# Patient Record
Sex: Female | Born: 1985 | Race: White | Hispanic: No | Marital: Married | Smoking: Never smoker
Health system: Southern US, Community
[De-identification: ages and names within clinical notes are randomized; demographics above are authoritative.]

## PROBLEM LIST (undated history)

## (undated) DIAGNOSIS — F32A Depression, unspecified: Secondary | ICD-10-CM

## (undated) DIAGNOSIS — F419 Anxiety disorder, unspecified: Secondary | ICD-10-CM

## (undated) HISTORY — PX: TUBAL LIGATION: SHX77

---
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
## 2004-08-07 ENCOUNTER — Emergency Department (HOSPITAL_COMMUNITY): Admission: EM | Admit: 2004-08-07 | Discharge: 2004-08-07 | Payer: Self-pay | Admitting: Family Medicine

## 2004-09-10 ENCOUNTER — Ambulatory Visit (HOSPITAL_COMMUNITY): Admission: RE | Admit: 2004-09-10 | Discharge: 2004-09-10 | Payer: Self-pay | Admitting: Obstetrics and Gynecology

## 2004-10-22 ENCOUNTER — Other Ambulatory Visit: Admission: RE | Admit: 2004-10-22 | Discharge: 2004-10-22 | Payer: Self-pay | Admitting: Obstetrics and Gynecology

## 2005-01-24 ENCOUNTER — Inpatient Hospital Stay (HOSPITAL_COMMUNITY): Admission: AD | Admit: 2005-01-24 | Discharge: 2005-01-24 | Payer: Self-pay | Admitting: Obstetrics and Gynecology

## 2005-02-28 ENCOUNTER — Inpatient Hospital Stay (HOSPITAL_COMMUNITY): Admission: AD | Admit: 2005-02-28 | Discharge: 2005-03-02 | Payer: Self-pay | Admitting: Obstetrics and Gynecology

## 2005-02-28 ENCOUNTER — Encounter (INDEPENDENT_AMBULATORY_CARE_PROVIDER_SITE_OTHER): Payer: Self-pay | Admitting: *Deleted

## 2005-09-12 ENCOUNTER — Emergency Department (HOSPITAL_COMMUNITY): Admission: EM | Admit: 2005-09-12 | Discharge: 2005-09-12 | Payer: Self-pay | Admitting: Family Medicine

## 2005-09-17 ENCOUNTER — Emergency Department (HOSPITAL_COMMUNITY): Admission: EM | Admit: 2005-09-17 | Discharge: 2005-09-17 | Payer: Self-pay | Admitting: Family Medicine

## 2005-10-25 ENCOUNTER — Other Ambulatory Visit: Admission: RE | Admit: 2005-10-25 | Discharge: 2005-10-25 | Payer: Self-pay | Admitting: Obstetrics and Gynecology

## 2005-11-24 ENCOUNTER — Emergency Department (HOSPITAL_COMMUNITY): Admission: EM | Admit: 2005-11-24 | Discharge: 2005-11-24 | Payer: Self-pay | Admitting: Family Medicine

## 2006-07-09 ENCOUNTER — Emergency Department (HOSPITAL_COMMUNITY): Admission: EM | Admit: 2006-07-09 | Discharge: 2006-07-09 | Payer: Self-pay | Admitting: Emergency Medicine

## 2006-07-09 IMAGING — US US FETAL BPP W/O NONSTRESS
1 series · 14 of 27 positions shown · non-contrast
Comparison: 09/10/04.

CLINICAL DATA: 35 week gestation with bleeding.  

 BIOPHYSICAL PROFILE

[Series 1: us fetal bpp w/o nonstress · 0.33mm/px · 14 of 27 slices shown]
[im 1/27]
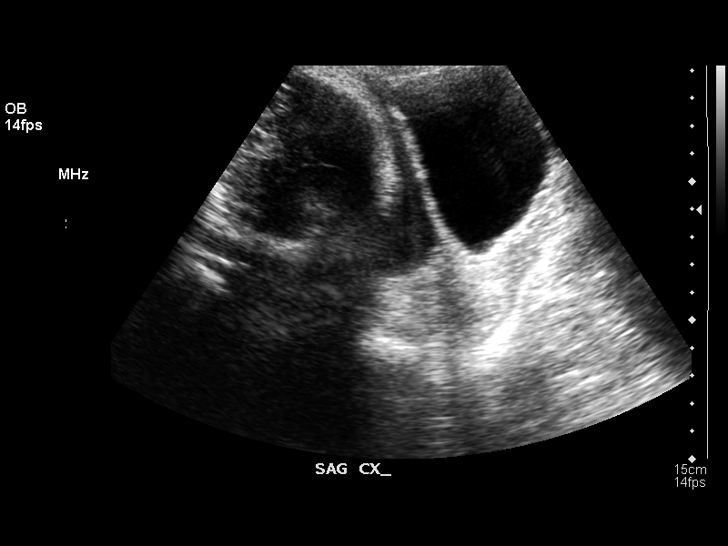
[im 3/27]
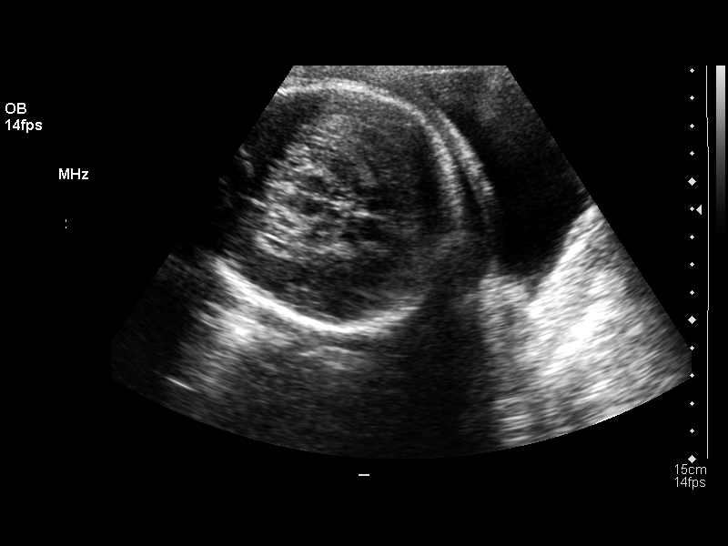
[im 5/27]
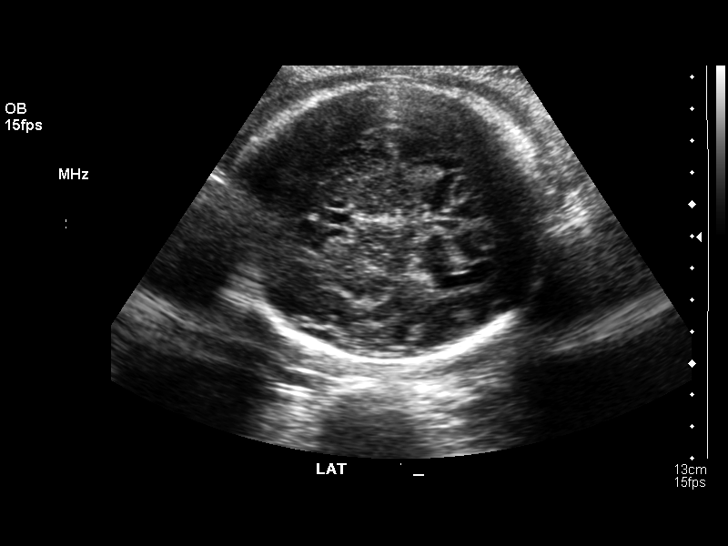
[im 7/27]
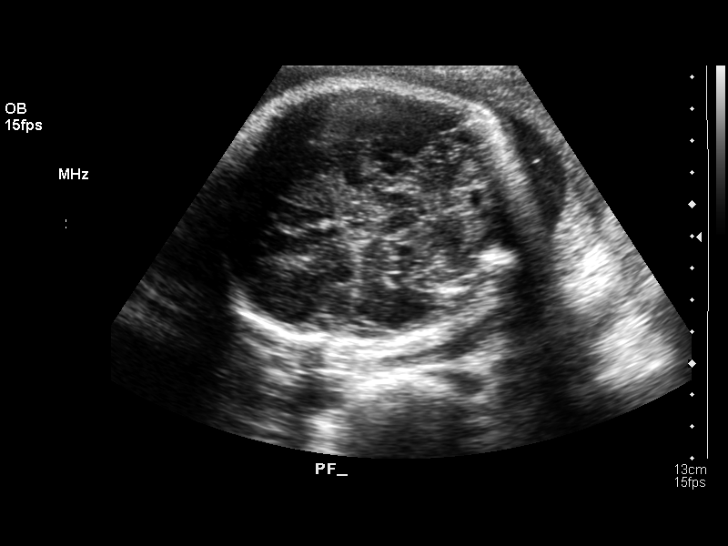
[im 9/27]
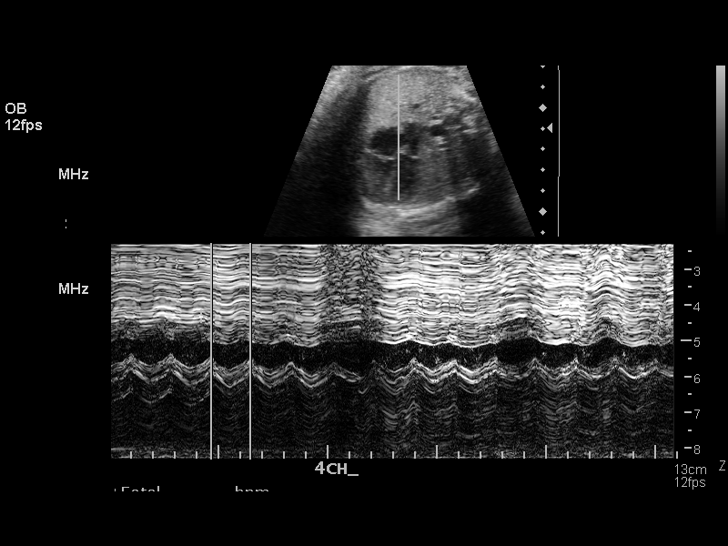
[im 11/27]
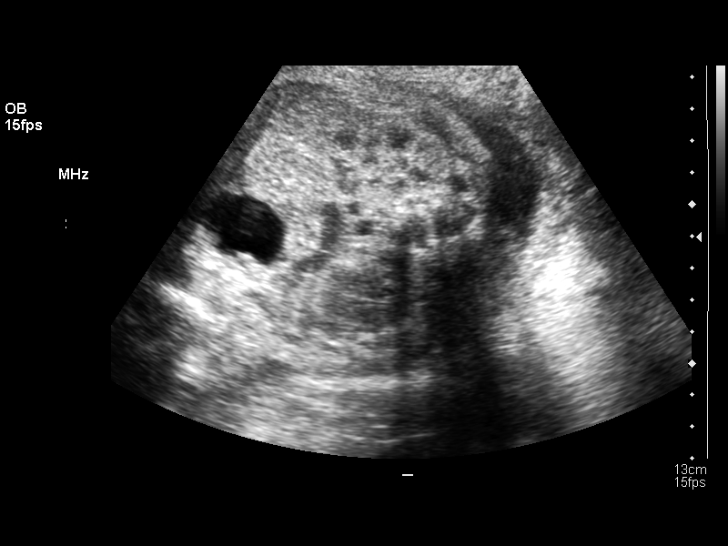
[im 13/27]
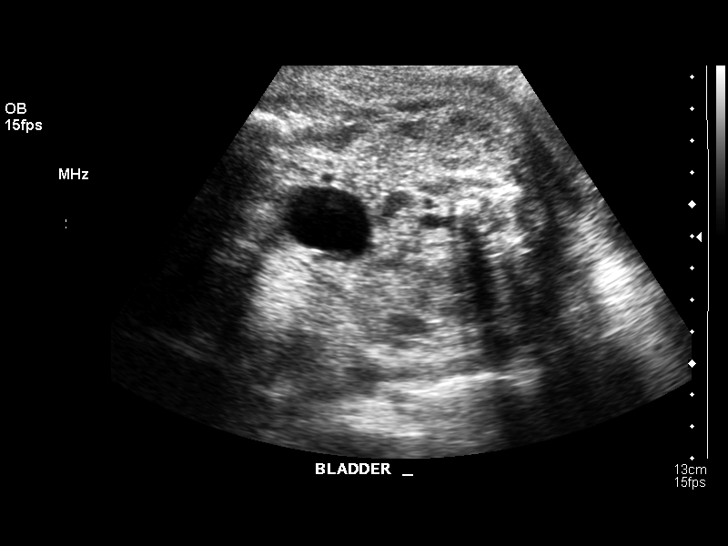
[im 15/27]
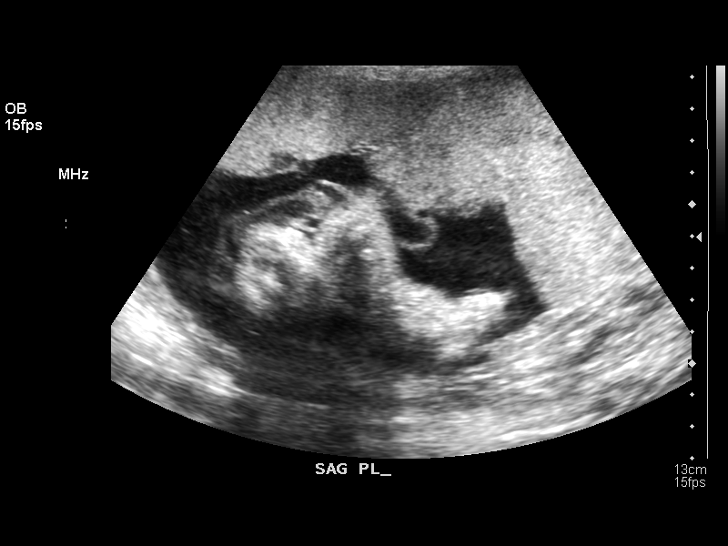
[im 17/27]
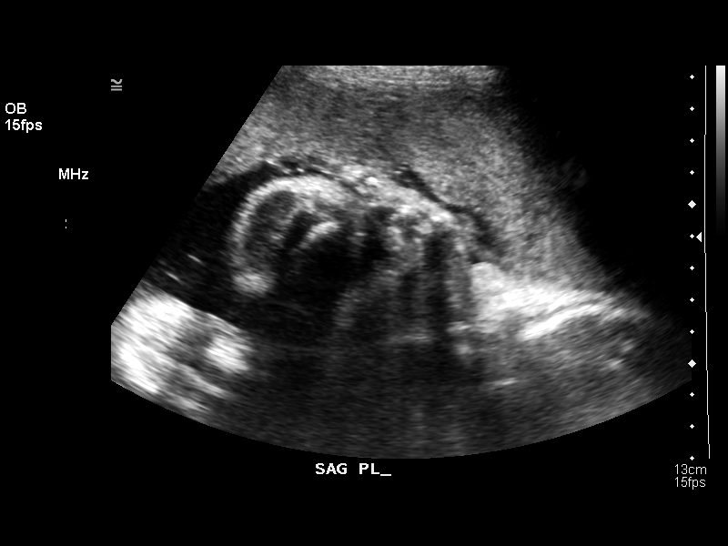
[im 19/27]
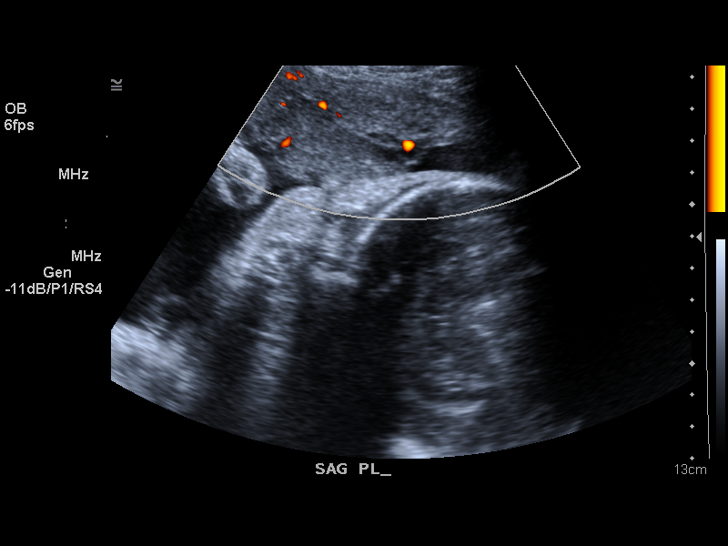
[im 21/27]
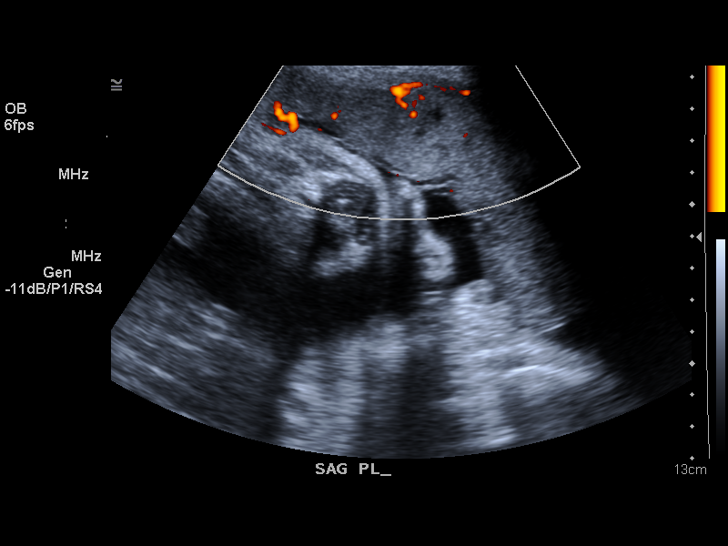
[im 23/27]
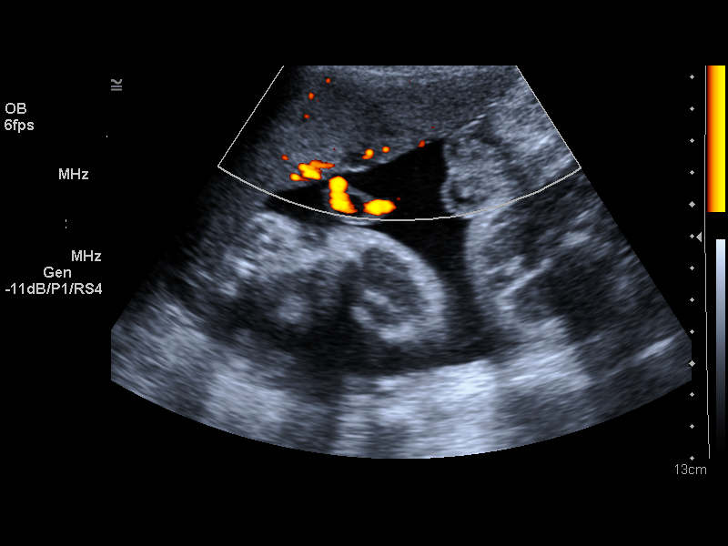
[im 25/27]
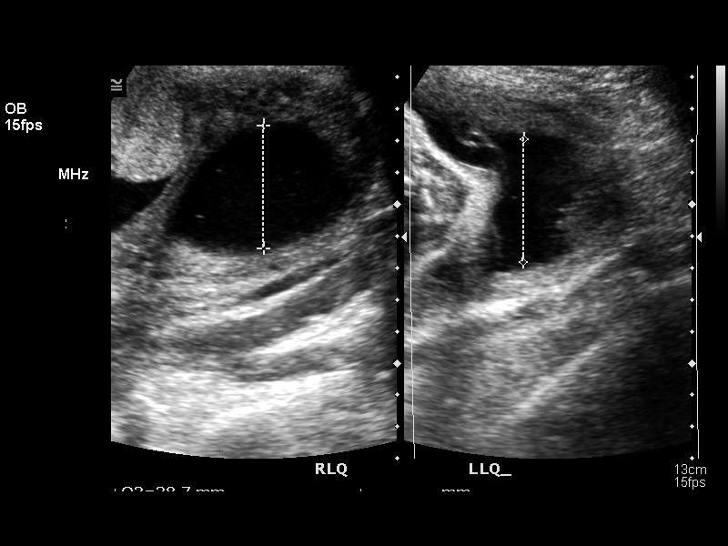
[im 27/27]
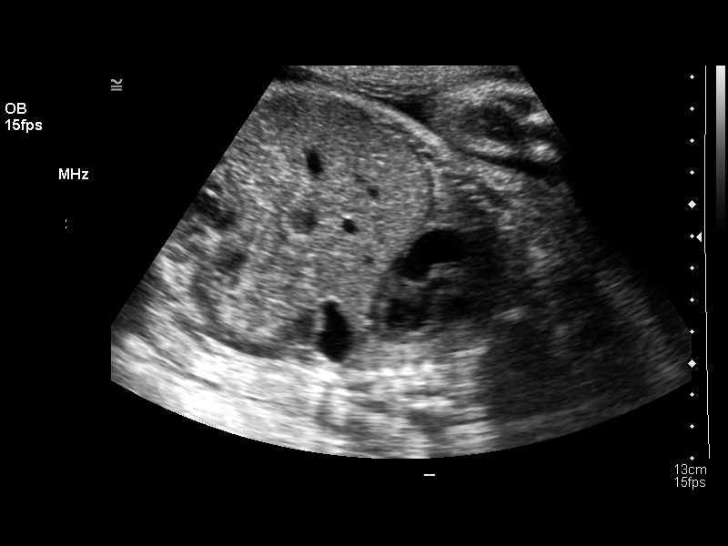

[14 of 27 positions shown; findings below may reference images not displayed]

Number of Fetuses:  1  
 Heart rate:  168
 Movement:  Yes
 Breathing:  Yes
 Presentation:  Cephalic
 Placental Location:  Anterior
 Grade: II
 Previa:  No
 Amniotic Fluid (Subjective):  Normal
 Amniotic Fluid (Objective):  17.3 cm AFI (5th -95th%ile = 8.1 ? 24.8 cm for 34 wks)

 Fetal measurements and complete anatomic evaluation were not requested.  The following fetal anatomy was visualized on this exam: Lateral ventricles, thalami, posterior fossa, four chamber heart, stomach, kidneys, bladder, and diaphragm.

 BPP SCORING
 Movements:  2  Time: 10 minutes
 Breathing:  2
 Tone:  2
 Amniotic Fluid:  2
 Total Score:  8

 MATERNAL UTERINE AND ADNEXAL FINDINGS
 Cervix:  3.3 cm Transabdominally.
IMPRESSION: 1.  Single living intrauterine fetus in cephalic presentation with subjectively and quantitatively normal amniotic fluid volume.  The AFI is 17.3 cm which is within normal limits for a 34 week gestation.
 2.  Biophysical profile score is [DATE] after 10 minutes of observation.

## 2006-07-20 ENCOUNTER — Emergency Department (HOSPITAL_COMMUNITY): Admission: EM | Admit: 2006-07-20 | Discharge: 2006-07-20 | Payer: Self-pay | Admitting: Family Medicine

## 2008-04-22 ENCOUNTER — Inpatient Hospital Stay (HOSPITAL_COMMUNITY): Admission: AD | Admit: 2008-04-22 | Discharge: 2008-04-22 | Payer: Self-pay | Admitting: Obstetrics & Gynecology

## 2008-12-10 ENCOUNTER — Inpatient Hospital Stay (HOSPITAL_COMMUNITY): Admission: AD | Admit: 2008-12-10 | Discharge: 2008-12-12 | Payer: Self-pay | Admitting: Obstetrics and Gynecology

## 2008-12-11 ENCOUNTER — Encounter (INDEPENDENT_AMBULATORY_CARE_PROVIDER_SITE_OTHER): Payer: Self-pay | Admitting: Obstetrics and Gynecology

## 2010-08-01 ENCOUNTER — Inpatient Hospital Stay (INDEPENDENT_AMBULATORY_CARE_PROVIDER_SITE_OTHER)
Admission: RE | Admit: 2010-08-01 | Discharge: 2010-08-01 | Disposition: A | Discharge status: Home / Self Care | Payer: Self-pay | Source: Ambulatory Visit | Attending: Emergency Medicine | Admitting: Emergency Medicine

## 2010-08-01 DIAGNOSIS — B9789 Other viral agents as the cause of diseases classified elsewhere: Secondary | ICD-10-CM

## 2010-08-01 DIAGNOSIS — E860 Dehydration: Secondary | ICD-10-CM

## 2010-08-01 DIAGNOSIS — J069 Acute upper respiratory infection, unspecified: Secondary | ICD-10-CM

## 2010-08-01 LAB — POCT I-STAT, CHEM 8
Chloride: 104 mEq/L (ref 96–112)
Creatinine, Ser: 0.7 mg/dL (ref 0.4–1.2)
HCT: 37 % (ref 36.0–46.0)
Hemoglobin: 12.6 g/dL (ref 12.0–15.0)
Potassium: 3.7 mEq/L (ref 3.5–5.1)
Sodium: 140 mEq/L (ref 135–145)

## 2010-08-01 LAB — HEPATIC FUNCTION PANEL
ALT: 18 U/L (ref 0–35)
AST: 22 U/L (ref 0–37)
Albumin: 3.8 g/dL (ref 3.5–5.2)

## 2010-08-01 LAB — POCT URINALYSIS DIPSTICK
Glucose, UA: 100 mg/dL — AB
Ketones, ur: 80 mg/dL — AB
Nitrite: NEGATIVE

## 2010-08-29 LAB — CBC
HCT: 28.6 % — ABNORMAL LOW (ref 36.0–46.0)
Hemoglobin: 10.8 g/dL — ABNORMAL LOW (ref 12.0–15.0)
MCHC: 32.6 g/dL (ref 30.0–36.0)
MCHC: 33.6 g/dL (ref 30.0–36.0)
MCV: 81.2 fL (ref 78.0–100.0)
MCV: 82.8 fL (ref 78.0–100.0)
Platelets: 207 10*3/uL (ref 150–400)
RBC: 3.94 MIL/uL (ref 3.87–5.11)
RDW: 14.8 % (ref 11.5–15.5)

## 2010-10-05 NOTE — Op Note (Signed)
NAMESERAPHIM, AFFINITO              ACCOUNT NO.:  [id number]   MEDICAL RECORD NO.:  [id number]          PATIENT TYPE:  INP   LOCATION:  9105                          FACILITY:  [facility]   PHYSICIAN:  Janine Limbo, M.D.DATE OF BIRTH:  01-20-1986   DATE OF PROCEDURE:  12/11/2008  DATE OF DISCHARGE:                               OPERATIVE REPORT   PREOPERATIVE DIAGNOSES:  1. Postpartum day #1.  2. Desires sterilization.   POSTOPERATIVE DIAGNOSES:  1. Postpartum day #1.  2. Desires sterilization.   PROCEDURE:  Postpartum bilateral tubal ligation.   SURGEON:  Janine Limbo, MD   FIRST ASSISTANT:  None.   ANESTHETIC:  Epidural.   DISPOSITION:  Ms. Heiman is a 25 year old female, now para 3-0-0-3, who  had a vaginal delivery of a healthy infant on December 11, 2008.  The  patient desires sterilization.  She understands the indications for her  surgical procedure and she accepts the risks of, but not limited to,  anesthetic complications, bleeding, infections, possible damage to the  surrounding organs, and possible tubal failure (17/1000).   FINDINGS:  The fallopian tubes were normal bilaterally.   DESCRIPTION OF PROCEDURE:  The patient was taken to the operating room  where it was determined that the epidural she had received for labor  would be adequate for her tubal ligation.  The patient's abdomen and  perineum were prepped with multiple layers of Betadine.  The bladder was  drained of urine.  The patient was sterilely draped.  The subumbilical  area was injected with 5 mL of 0.5% Marcaine with epinephrine.  A  subumbilical incision was made and carried sharply through the  subcutaneous tissue, fascia, and the anterior peritoneum.  The left  fallopian tube was identified and followed to its fimbriated end.  A  knuckle of tube was made on the left using a free tie and then a  ligature of 0 plain catgut.  The knuckle of tube thus made was excised.  Hemostasis was adequate.   An identical procedure was carried out on the  opposite side.  Again, hemostasis was adequate.  All incisions were  removed.  The fascia was closed using a running suture of 2-0 Vicryl.  The skin was reapproximated using a  subcuticular suture of 3-0 Monocryl.  Sponge, needle, and instrument  counts were correct on 2 occasions.  Estimated blood loss for the  procedure was 2 mL.  The patient tolerated her procedure well.  The  patient was taken to the recovery room in stable condition.  The  portions of the fallopian tubes were sent to Pathology for evaluation.      Janine Limbo, M.D.  Electronically Signed     AVS/MEDQ  D:  12/11/2008  T:  12/12/2008  Job:  [id number]

## 2010-10-05 NOTE — H&P (Signed)
NAMEMEAGHEN, VECCHIARELLI              ACCOUNT NO.:  [id number]   MEDICAL RECORD NO.:  [id number]          PATIENT TYPE:  INP   LOCATION:  9105                          FACILITY:  [facility]   PHYSICIAN:  Crist Fat. Rivard, M.D. DATE OF BIRTH:  August 29, 1985   DATE OF ADMISSION:  12/10/2008  DATE OF DISCHARGE:                              HISTORY & PHYSICAL   Labor and Delivery at 2200.   A 25 year old gravida 3, para 2, with last menstrual period  questionable, ultrasound dating was done on April 22, 2008, equals  December 19, 2008, presented to the MAU complaining of contractions.  Vaginal exam on admission to MAU with 6 cm dilated, -1 to -2 station.  Prenatal care began June 17, 2008, at [facility].   ADMITTING DIAGNOSES:  1. Intrauterine pregnancy at 39 weeks with active labor.  2. PENICILLIN allergy.  3. Group B streptococcus negative.  4. Scoliosis.  5. Plan postpartum tubal.  Tubal papers were signed.   HISTORY OF PRESENT ILLNESS:  Prenatal care, history of BV treated with  Flagyl in second trimester.  Urine culture on July 02, 2008,  positive Klebsiella treated with Macrobid.  She did sign tubal papers.   PAST MEDICAL HISTORY:  She is allergic to PENICILLIN.  She has been  anemic and on iron this pregnancy and in the past, history of scoliosis.  She has been treated for urinary tract infection, this pregnancy  declines AFP.   PAST SURGICAL HISTORY:  Denies.   FAMILY HISTORY:  Chronic hypertension in her mother and her father.  Maternal grandmother had a stroke.  Sister has biopolar depression.   SOCIAL HISTORY:  She is separated from her husband, ONEISHA AMMONS.  The  patient is a high school graduate.  Denies smoking, drugs, and alcohol.   MEDICATIONS:  Taking prenatal vitamins.   LABORATORY DATA:  She is O positive.  Rh antibody negative.  VDRL  nonreactive.  Rubella immune.  HBsAg negative.  Pap within normal  limits.  GC and CT negative.   PHYSICAL  EXAMINATION:  HEART:  Regular rate without murmur.  LUNGS:  Clear bilaterally.  ABDOMEN:  Gravid.  Estimated fetal weight 7 pounds.  GENITOURINARY:  Vaginal exam, 6 cm, -1, 0 station, vertex.  Contractions  every 3-4 minutes, intact bag of water, uncomfortable with contractions.   ASSESSMENT:  Intrauterine pregnancy at term with active labor.   PLAN:  1. Admit to [facility], Dr. Estanislado Pandy.  2. Hydrate with IV fluids.  3. Off her pain medications if desired, anticipate birth.  4. Plan postpartum tubal as directed by Dr. Estanislado Pandy.  Dr. Estanislado Pandy was      notified of the patient's status and is aware of the patient being      admitted to Labor and Delivery.      Jasmine Awe, CNM      Dois Davenport A. Rivard, M.D.  Electronically Signed    JM/MEDQ  D:  12/10/2008  T:  12/11/2008  Job:  [id number]
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
## 2010-10-08 NOTE — Discharge Summary (Signed)
Maria Webb, Maria Webb              ACCOUNT NO.:  [id number]   MEDICAL RECORD NO.:  [id number]          PATIENT TYPE:  INP   LOCATION:  9105                          [facility]   PHYSICIAN:  Crist Fat. Rivard, M.D. DATE OF BIRTH:  10-05-1985   DATE OF ADMISSION:  12/10/2008  DATE OF DISCHARGE:  12/12/2008                               DISCHARGE SUMMARY   Maria Webb is a 25 year old gravida 3 now para 3-0-0-3 who presented on  December 10, 2008 in active labor and had a normal spontaneous vaginal  delivery same day, birth of a daughter who weighed 7 pounds 2 ounces  with Apgars scores of 9 at 1 minute and 9 at 5 minutes.   ADMISSION DIAGNOSES:  1. Single intrauterine pregnancy at 38.[redacted] weeks gestation.  2. Scoliosis.  3. Anemia.  4. PENICILLIN allergy.   DISCHARGE DIAGNOSES:  1. Stable postpartum day #2.  2. Stable postoperative day #1.  3. Scoliosis.  4. Anemia.  5. PENICILLIN allergy.   PERTINENT LABORATORY DATA:  Maria Webb is O+, rubella immune and had a  negative RPR test in the hospital.  The following labs are for December 12, 2008 and December 10, 2008 respectively.  WBC 13.7 (9.3) HGB 9.3 (10.8), HCT  28.6 (32.0), PLT 207 (238).   PROCEDURES:  Maria Webb had the following procedures; normal spontaneous  vaginal delivery, cord blood banking, epidural bilateral tubal ligation  and fallopian tube segments to pathology.   COURSE OF STAY:  As stated earlier, she had a vaginal delivery on December 10, 2008 without complications and tubal ligation was done the next day  without complications on December 11, 2008 and she progressed well on the  clinical pathway of care to go home with a discharge on December 12, 2008.   DISCHARGE PHYSICAL EXAMINATION:  HEENT within normal limits.  The  patient was denying any dizziness, heavy bleeding, clots.  This day her  pain was controlled and she was passing flatus.  She was bottle feeding  and was having no problems voiding.  She was afebrile with  stable vital  signs.  Her lungs were clear to auscultation bilaterally.  Her heart was  of a regular rate and rhythm and without murmurs.  Her breasts were  soft.  She did have a tight bra on.  Her abdomen was appropriately  tender around the umbilicus.  She did have mild distention.  The  umbilical dressing was clean, dry and intact.  The fundus was firm and  below the umbilicus.  Her perineum and vulva were intact with light  lochia from the vagina.  Her extremities were without edema and she had  normal deep tendon reflexes and a negative Homans' sign x2.   She was asking when she could ride a roller coaster and eager to get  back to her life outside of the hospital.  She was advised not to ride a  roller coaster for at least a month.  She was provided with the  discharge instruction booklet and the danger signs of the postoperative  and the postpartum period were  reviewed with the patient.  She was sent  home on the following medications.  1. Prenatal vitamin.  2. Integra F.  3. Motrin 600 mg.  4. Percocet.  5. She was also cautioned to prevent constipation and encouraged to      take Colace p.r.n.   She is to follow up in the office in 6 weeks or sooner for problems.  She is deemed to have received full benefit for the hospitalization of  the birth of her daughter.      Janna Melsness, CNM      Sandra A. Rivard, M.D.  Electronically Signed    JM/MEDQ  D:  12/22/2008  T:  12/22/2008  Job:  [id number]

## 2011-02-24 LAB — GC/CHLAMYDIA PROBE AMP, GENITAL
Chlamydia, DNA Probe: NEGATIVE
GC Probe Amp, Genital: NEGATIVE

## 2011-02-24 LAB — URINALYSIS, ROUTINE W REFLEX MICROSCOPIC
Bilirubin Urine: NEGATIVE
Glucose, UA: NEGATIVE mg/dL
Ketones, ur: NEGATIVE mg/dL
Protein, ur: NEGATIVE mg/dL

## 2011-02-24 LAB — WET PREP, GENITAL: Trich, Wet Prep: NONE SEEN

## 2011-02-24 LAB — URINE MICROSCOPIC-ADD ON

## 2011-08-06 ENCOUNTER — Emergency Department (HOSPITAL_COMMUNITY)
Admission: EM | Admit: 2011-08-06 | Discharge: 2011-08-07 | Disposition: A | Discharge status: Home / Self Care | Payer: Self-pay | Attending: Emergency Medicine | Admitting: Emergency Medicine

## 2011-08-06 ENCOUNTER — Encounter (HOSPITAL_COMMUNITY): Payer: Self-pay | Admitting: Nurse Practitioner

## 2011-08-06 ENCOUNTER — Emergency Department (HOSPITAL_COMMUNITY)
Admission: EM | Admit: 2011-08-06 | Discharge: 2011-08-06 | Disposition: A | Discharge status: Nursing Home (Includes ICF) | Payer: Self-pay | Source: Home / Self Care

## 2011-08-06 ENCOUNTER — Encounter (HOSPITAL_COMMUNITY): Payer: Self-pay | Admitting: Emergency Medicine

## 2011-08-06 DIAGNOSIS — R5383 Other fatigue: Secondary | ICD-10-CM | POA: Insufficient documentation

## 2011-08-06 DIAGNOSIS — R05 Cough: Secondary | ICD-10-CM | POA: Insufficient documentation

## 2011-08-06 DIAGNOSIS — E860 Dehydration: Secondary | ICD-10-CM

## 2011-08-06 DIAGNOSIS — R1013 Epigastric pain: Secondary | ICD-10-CM | POA: Insufficient documentation

## 2011-08-06 DIAGNOSIS — R059 Cough, unspecified: Secondary | ICD-10-CM | POA: Insufficient documentation

## 2011-08-06 DIAGNOSIS — B349 Viral infection, unspecified: Secondary | ICD-10-CM

## 2011-08-06 DIAGNOSIS — R5381 Other malaise: Secondary | ICD-10-CM | POA: Insufficient documentation

## 2011-08-06 DIAGNOSIS — B9789 Other viral agents as the cause of diseases classified elsewhere: Secondary | ICD-10-CM | POA: Insufficient documentation

## 2011-08-06 DIAGNOSIS — R112 Nausea with vomiting, unspecified: Secondary | ICD-10-CM

## 2011-08-06 LAB — POCT I-STAT, CHEM 8
BUN: 11 mg/dL (ref 6–23)
Calcium, Ion: 1.09 mmol/L — ABNORMAL LOW (ref 1.12–1.32)
Hemoglobin: 12.9 g/dL (ref 12.0–15.0)
TCO2: 24 mmol/L (ref 0–100)

## 2011-08-06 LAB — POCT URINALYSIS DIP (DEVICE)
Glucose, UA: 100 mg/dL — AB
Specific Gravity, Urine: 1.02 (ref 1.005–1.030)
Urobilinogen, UA: >=8 mg/dL (ref 0.0–1.0)

## 2011-08-06 MED ORDER — ACETAMINOPHEN 325 MG PO TABS
975.0000 mg | ORAL_TABLET | Freq: Once | ORAL | Status: DC
  Filled 2011-08-06: qty 3

## 2011-08-06 MED ORDER — SODIUM CHLORIDE 0.9 % IV BOLUS (SEPSIS)
1000.0000 mL | Freq: Once | INTRAVENOUS | Status: AC
  Administered 2011-08-06: 1000 mL via INTRAVENOUS

## 2011-08-06 MED ORDER — ONDANSETRON 4 MG PO TBDP
4.0000 mg | ORAL_TABLET | Freq: Once | ORAL | Status: AC
  Administered 2011-08-06: 4 mg via ORAL

## 2011-08-06 MED ORDER — ONDANSETRON 4 MG PO TBDP
ORAL_TABLET | ORAL | Status: AC
  Filled 2011-08-06: qty 1

## 2011-08-06 MED ORDER — ONDANSETRON HCL 4 MG/2ML IJ SOLN
4.0000 mg | Freq: Once | INTRAMUSCULAR | Status: AC
  Administered 2011-08-06: 4 mg via INTRAVENOUS
  Filled 2011-08-06: qty 2

## 2011-08-06 MED ORDER — ACETAMINOPHEN 325 MG PO TABS
650.0000 mg | ORAL_TABLET | Freq: Once | ORAL | Status: AC
  Administered 2011-08-06: 650 mg via ORAL

## 2011-08-06 MED ORDER — PANTOPRAZOLE SODIUM 40 MG IV SOLR
40.0000 mg | Freq: Once | INTRAVENOUS | Status: AC
  Administered 2011-08-06: 40 mg via INTRAVENOUS
  Filled 2011-08-06: qty 40

## 2011-08-06 NOTE — ED Notes (Signed)
Pt with c/o nausea vomiting cough and congestion onset Monday - per pt dry heaving today remains nauseated denies urinary symptoms - has not eaten since Monday - sipping on clear liquids - denies diarrhea

## 2011-08-06 NOTE — ED Provider Notes (Signed)
Medical screening examination/treatment/procedure(s) were performed by non-physician practitioner and as supervising physician I was immediately available for consultation/collaboration.  Raynald Blend, MD 08/06/11 [phone]

## 2011-08-06 NOTE — ED Notes (Signed)
I gave the patient a cup of ice and a coke. 

## 2011-08-06 NOTE — ED Notes (Signed)
C/o abd pain, n/v for past week, an ddizziness. UCC sent for further eval of dehydration

## 2011-08-06 NOTE — ED Provider Notes (Signed)
History     CSN: [id number]  Arrival date & time 08/06/11  1836   First MD Initiated Contact with Patient 08/06/11 2032      Chief Complaint  Patient presents with  . Dehydration     HPI  History provided by the patient and significant other. Patient is a 26 year old female with no significant past medical history who presents with complaints of nausea vomiting for the past 6 days. Patient reports the symptoms began on Monday and have been persistently worsening. Patient reports her child was recently sick with vomiting symptoms but only lasting a few days. Patient has been trying to keep down fluids but has been having difficulty. She reports some associated fever and chills at home. She also reports onset of epigastric pains for the past 2 days. He has been constant. It is worse with vomiting. She is not taking any medications. Patient denies any diarrhea or constipation symptoms. Patient also had some dry cough. Denies nasal congestion, rhinorrhea or sore throat. Denies headache or neck stiffness. Symptoms are described as moderate to severe. Patient was seen in urgent care Center and ice to the emergency room due to concerns for dehydration.    History reviewed. No pertinent past medical history.  Past Surgical History  Procedure Date  . Tubal ligation     History reviewed. No pertinent family history.  History  Substance Use Topics  . Smoking status: Never Smoker   . Smokeless tobacco: Not on file  . Alcohol Use: No    OB History    Grav Para Term Preterm Abortions TAB SAB Ect Mult Living                  Review of Systems  Constitutional: Positive for fever, chills, appetite change and fatigue.  Respiratory: Positive for cough. Negative for shortness of breath.   Cardiovascular: Negative for chest pain.  Gastrointestinal: Positive for nausea, vomiting and abdominal pain. Negative for diarrhea and constipation.  Genitourinary: Negative for dysuria, frequency,  hematuria, flank pain, vaginal bleeding and vaginal discharge.  All other systems reviewed and are negative.    Allergies  Penicillins  Home Medications   Current Outpatient Rx  Name Route Sig Dispense Refill  . ACETAMINOPHEN 500 MG PO TABS Oral Take 500 mg by mouth every 6 (six) hours as needed. For fever      BP 110/75  Pulse 118  Temp(Src) 100.7 F (38.2 C) (Oral)  Resp 16  Ht 5\' 4"  (1.626 m)  Wt 100 lb (45.36 kg)  BMI 17.17 kg/m2  SpO2 100%  LMP 08/01/2011  Physical Exam  Nursing note and vitals reviewed. Constitutional: She is oriented to person, place, and time. She appears well-developed and well-nourished. No distress.  HENT:  Head: Normocephalic.       Mouth is dry  Neck: Normal range of motion. Neck supple.       No meningeal sign  Cardiovascular: Normal rate and regular rhythm.   Pulmonary/Chest: Effort normal and breath sounds normal. No respiratory distress. She has no wheezes. She has no rales.  Abdominal: Soft. Bowel sounds are normal. She exhibits no distension. There is no tenderness. There is no rebound and no guarding.       Mild epigastric pain. Abdomen soft  Lymphadenopathy:    She has no cervical adenopathy.  Neurological: She is alert and oriented to person, place, and time.  Skin: Skin is warm and dry. No rash noted.  Psychiatric: She has a normal mood and  affect. Her behavior is normal.    ED Course  Procedures  Results for orders placed during the hospital encounter of 08/06/11  URINALYSIS, ROUTINE W REFLEX MICROSCOPIC      Component Value Range   Color, Urine YELLOW  YELLOW    APPearance CLOUDY (*) CLEAR    Specific Gravity, Urine 1.023  1.005 - 1.030    pH 6.0  5.0 - 8.0    Glucose, UA NEGATIVE  NEGATIVE (mg/dL)   Hgb urine dipstick SMALL (*) NEGATIVE    Bilirubin Urine SMALL (*) NEGATIVE    Ketones, ur >80 (*) NEGATIVE (mg/dL)   Protein, ur NEGATIVE  NEGATIVE (mg/dL)   Urobilinogen, UA >4.0 (*) 0.0 - 1.0 (mg/dL)   Nitrite  NEGATIVE  NEGATIVE    Leukocytes, UA NEGATIVE  NEGATIVE   POCT I-STAT, CHEM 8      Component Value Range   Sodium 139  135 - 145 (mEq/L)   Potassium 3.3 (*) 3.5 - 5.1 (mEq/L)   Chloride 102  96 - 112 (mEq/L)   BUN 11  6 - 23 (mg/dL)   Creatinine, Ser 9.81  0.50 - 1.10 (mg/dL)   Glucose, Bld 99  70 - 99 (mg/dL)   Calcium, Ion 1.91 (*) 1.12 - 1.32 (mmol/L)   TCO2 24  0 - 100 (mmol/L)   Hemoglobin 12.9  12.0 - 15.0 (g/dL)   HCT 47.8  29.5 - 62.1 (%)  URINE MICROSCOPIC-ADD ON      Component Value Range   Squamous Epithelial / LPF FEW (*) RARE    WBC, UA 0-2  <3 (WBC/hpf)   RBC / HPF 3-6  <3 (RBC/hpf)   Bacteria, UA FEW (*) RARE         1. Nausea & vomiting   2. Dehydration   3. Viral syndrome       MDM  Patient seen and evaluated. Patient in no acute distress. Plan to hydrate with IV fluids and treat nausea symptoms with Zofran. We'll also obtain i-STAT 8 to look at chemistry.  Pt discussed with Attending physicain.  Plan to hydrate and check basic labs  Pt now feeling much better after fluids and zofran.  Pt has tolerated PO fluids.      Medical screening examination/treatment/procedure(s) were performed by non-physician practitioner and as supervising physician I was immediately available for consultation/collaboration. Osvaldo Human, M.D.    Angus Seller, PA 08/07/11 1908  Carleene Cooper III, MD 08/09/11 1130

## 2011-08-06 NOTE — ED Notes (Signed)
Patient took and held down sips of water with her acetaminophen.

## 2011-08-06 NOTE — ED Notes (Signed)
Pt states sx started Monday - vomiting, fever. Has not been able to eat since Tuesday. Pt is coughing and experiencing pain from zygomatic process up to throat.

## 2011-08-06 NOTE — ED Provider Notes (Signed)
History     CSN: [id number]  Arrival date & time 08/06/11  1549   None     Chief Complaint  Patient presents with  . Emesis    (Consider location/radiation/quality/duration/timing/severity/associated sxs/prior treatment) HPI Comments: Patient states she began 5 days ago with fever and vomiting. She continues with nausea and vomiting. She is unable to keep any fluids down, and even vomited after taking a dose of cough medication today. No diarrhea. She began with a nonproductive cough 2 days ago. No nasal congestion, sore throat, dyspnea or wheezing. She has pain in her epigastrum. Also c/o dizziness x 2 days.   History reviewed. No pertinent past medical history.  Past Surgical History  Procedure Date  . Tubal ligation     History reviewed. No pertinent family history.  History  Substance Use Topics  . Smoking status: Never Smoker   . Smokeless tobacco: Not on file  . Alcohol Use: No    OB History    Grav Para Term Preterm Abortions TAB SAB Ect Mult Living                  Review of Systems  Constitutional: Positive for fever, chills and fatigue.  HENT: Negative for ear pain, congestion, sore throat, rhinorrhea and sinus pressure.   Respiratory: Positive for cough. Negative for shortness of breath and wheezing.   Cardiovascular: Negative for chest pain.  Gastrointestinal: Positive for nausea, vomiting and abdominal pain. Negative for diarrhea.  Genitourinary: Negative for dysuria, frequency and decreased urine volume.  Neurological: Positive for light-headedness. Negative for headaches.    Allergies  Penicillins  Home Medications  No current outpatient prescriptions on file.  BP 107/63  Pulse 119  Temp(Src) 100.5 F (38.1 C) (Oral)  Resp 16  SpO2 98%  LMP 08/01/2011  Physical Exam  Constitutional: She appears well-developed and well-nourished. No distress.       Alert and talkative.   HENT:  Head: Normocephalic and atraumatic.  Right Ear: Tympanic  membrane, external ear and ear canal normal.  Left Ear: Tympanic membrane, external ear and ear canal normal.  Nose: Nose normal.  Mouth/Throat: Uvula is midline, oropharynx is clear and moist and mucous membranes are normal. No oropharyngeal exudate, posterior oropharyngeal edema or posterior oropharyngeal erythema.  Neck: Neck supple.  Cardiovascular: Normal rate, regular rhythm and normal heart sounds.   Pulmonary/Chest: Effort normal and breath sounds normal. No respiratory distress.  Abdominal: Soft. Bowel sounds are normal. She exhibits no distension and no mass. There is no hepatosplenomegaly. There is tenderness in the epigastric area. There is no rebound and no guarding.  Lymphadenopathy:    She has no cervical adenopathy.  Neurological: She is alert.  Skin: Skin is warm and dry.  Psychiatric: She has a normal mood and affect.    ED Course  Procedures (including critical care time)  Labs Reviewed  POCT URINALYSIS DIP (DEVICE) - Abnormal; Notable for the following:    Glucose, UA 100 (*)    Bilirubin Urine MODERATE (*)    Ketones, ur >=160 (*)    Hgb urine dipstick MODERATE (*)    Protein, ur 100 (*)    All other components within normal limits  POCT PREGNANCY, URINE   No results found.   1. Dehydration   2. Nausea & vomiting       MDM  Pt transferred to ED.        Melody Comas, [place] 08/06/11 1818

## 2011-08-06 NOTE — ED Notes (Signed)
Pt also states that she is experiencing muscular soreness throughout her body.

## 2011-08-07 LAB — URINALYSIS, ROUTINE W REFLEX MICROSCOPIC
Glucose, UA: NEGATIVE mg/dL
Ketones, ur: >80 mg/dL — AB
Leukocytes, UA: NEGATIVE
Nitrite: NEGATIVE
Protein, ur: NEGATIVE mg/dL
Urobilinogen, UA: >8 mg/dL — ABNORMAL HIGH (ref 0.0–1.0)

## 2011-08-07 LAB — URINE MICROSCOPIC-ADD ON

## 2011-08-07 MED ORDER — ONDANSETRON 8 MG PO TBDP: ORAL_TABLET | ORAL | Status: AC

## 2011-08-07 NOTE — ED Notes (Signed)
Rx given x1 D/c instructions reviewed w/ pt - pt denies any further questions or concerns at present.   

## 2011-08-07 NOTE — Discharge Instructions (Signed)
You were seen and evaluated today for your symptoms of nausea vomiting.   Test today did not show any concerning signs to cause your symptoms. Your urine test did show signs concerning for dehydration. Your given IV fluids for rehydration. You were also treated with medications for nausea and vomiting and had been tolerating fluids. At this time your providers feel you are able to return home with medication to help with your nausea vomiting symptoms. Continue to drink plenty of water to stay hydrated.   Dehydration, Adult Dehydration is when you lose more fluids from the body than you take in. Vital organs like the kidneys, brain, and heart cannot function without a proper amount of fluids and salt. Any loss of fluids from the body can cause dehydration.  CAUSES   Vomiting.   Diarrhea.   Excessive sweating.   Excessive urine output.   Fever.  SYMPTOMS  Mild dehydration  Thirst.   Dry lips.   Slightly dry mouth.  Moderate dehydration  Very dry mouth.   Sunken eyes.   Skin does not bounce back quickly when lightly pinched and released.   Dark urine and decreased urine production.   Decreased tear production.   Headache.  Severe dehydration  Very dry mouth.   Extreme thirst.   Rapid, weak pulse (more than 100 beats per minute at rest).   Cold hands and feet.   Not able to sweat in spite of heat and temperature.   Rapid breathing.   Blue lips.   Confusion and lethargy.   Difficulty being awakened.   Minimal urine production.   No tears.  DIAGNOSIS  Your caregiver will diagnose dehydration based on your symptoms and your exam. Blood and urine tests will help confirm the diagnosis. The diagnostic evaluation should also identify the cause of dehydration. TREATMENT  Treatment of mild or moderate dehydration can often be done at home by increasing the amount of fluids that you drink. It is best to drink small amounts of fluid more often. Drinking too much at one  time can make vomiting worse. Refer to the home care instructions below. Severe dehydration needs to be treated at the hospital where you will probably be given intravenous (IV) fluids that contain water and electrolytes. HOME CARE INSTRUCTIONS   Ask your caregiver about specific rehydration instructions.   Drink enough fluids to keep your urine clear or pale yellow.   Drink small amounts frequently if you have nausea and vomiting.   Eat as you normally do.   Avoid:   Foods or drinks high in sugar.   Carbonated drinks.   Juice.   Extremely hot or cold fluids.   Drinks with caffeine.   Fatty, greasy foods.   Alcohol.   Tobacco.   Overeating.   Gelatin desserts.   Wash your hands well to avoid spreading bacteria and viruses.   Only take over-the-counter or prescription medicines for pain, discomfort, or fever as directed by your caregiver.   Ask your caregiver if you should continue all prescribed and over-the-counter medicines.   Keep all follow-up appointments with your caregiver.  SEEK MEDICAL CARE IF:  You have abdominal pain and it increases or stays in one area (localizes).   You have a rash, stiff neck, or severe headache.   You are irritable, sleepy, or difficult to awaken.   You are weak, dizzy, or extremely thirsty.  SEEK IMMEDIATE MEDICAL CARE IF:   You are unable to keep fluids down or you get worse despite  treatment.   You have frequent episodes of vomiting or diarrhea.   You have blood or green matter (bile) in your vomit.   You have blood in your stool or your stool looks black and tarry.   You have not urinated in 6 to 8 hours, or you have only urinated a small amount of very dark urine.   You have a fever.   You faint.  MAKE SURE YOU:   Understand these instructions.   Will watch your condition.   Will get help right away if you are not doing well or get worse.  Document Released: 05/09/2005 Document Revised: 04/28/2011 Document  Reviewed: 12/27/2010 [facility] Patient Information 2012 [place], [place].    Nausea and Vomiting Nausea is a sick feeling that often comes before throwing up (vomiting). Vomiting is a reflex where stomach contents come out of your mouth. Vomiting can cause severe loss of body fluids (dehydration). Children and elderly adults can become dehydrated quickly, especially if they also have diarrhea. Nausea and vomiting are symptoms of a condition or disease. It is important to find the cause of your symptoms. CAUSES   Direct irritation of the stomach lining. This irritation can result from increased acid production (gastroesophageal reflux disease), infection, food poisoning, taking certain medicines (such as nonsteroidal anti-inflammatory drugs), alcohol use, or tobacco use.   Signals from the brain.These signals could be caused by a headache, heat exposure, an inner ear disturbance, increased pressure in the brain from injury, infection, a tumor, or a concussion, pain, emotional stimulus, or metabolic problems.   An obstruction in the gastrointestinal tract (bowel obstruction).   Illnesses such as diabetes, hepatitis, gallbladder problems, appendicitis, kidney problems, cancer, sepsis, atypical symptoms of a heart attack, or eating disorders.   Medical treatments such as chemotherapy and radiation.   Receiving medicine that makes you sleep (general anesthetic) during surgery.  DIAGNOSIS Your caregiver may ask for tests to be done if the problems do not improve after a few days. Tests may also be done if symptoms are severe or if the reason for the nausea and vomiting is not clear. Tests may include:  Urine tests.   Blood tests.   Stool tests.   Cultures (to look for evidence of infection).   X-rays or other imaging studies.  Test results can help your caregiver make decisions about treatment or the need for additional tests. TREATMENT You need to stay well hydrated. Drink frequently but  in small amounts.You may wish to drink water, sports drinks, clear broth, or eat frozen ice pops or gelatin dessert to help stay hydrated.When you eat, eating slowly may help prevent nausea.There are also some antinausea medicines that may help prevent nausea. HOME CARE INSTRUCTIONS   Take all medicine as directed by your caregiver.   If you do not have an appetite, do not force yourself to eat. However, you must continue to drink fluids.   If you have an appetite, eat a normal diet unless your caregiver tells you differently.   Eat a variety of complex carbohydrates (rice, wheat, potatoes, bread), lean meats, yogurt, fruits, and vegetables.   Avoid high-fat foods because they are more difficult to digest.   Drink enough water and fluids to keep your urine clear or pale yellow.   If you are dehydrated, ask your caregiver for specific rehydration instructions. Signs of dehydration may include:   Severe thirst.   Dry lips and mouth.   Dizziness.   Dark urine.   Decreasing urine frequency and  amount.   Confusion.   Rapid breathing or pulse.  SEEK IMMEDIATE MEDICAL CARE IF:   You have blood or brown flecks (like coffee grounds) in your vomit.   You have black or bloody stools.   You have a severe headache or stiff neck.   You are confused.   You have severe abdominal pain.   You have chest pain or trouble breathing.   You do not urinate at least once every 8 hours.   You develop cold or clammy skin.   You continue to vomit for longer than 24 to 48 hours.   You have a fever.  MAKE SURE YOU:   Understand these instructions.   Will watch your condition.   Will get help right away if you are not doing well or get worse.  Document Released: 05/09/2005 Document Revised: 04/28/2011 Document Reviewed: 10/06/2010 [facility] Patient Information 2012 [place], [place].  RESOURCE GUIDE  Dental Problems  Patients with Medicaid: [facility] [phone]                                           [phone] Phone:  [phone]                                                  Phone:  [phone]  If unable to pay or uninsured, contact:  [facility] or [facility] to become qualified for the adult dental clinic.  Chronic Pain Problems Contact [facility]  [phone] Patients need to be referred by their primary care doctor.  Insufficient Money for Medicine Contact [employer]:  call "211" or [facility] [phone].  No Primary Care Doctor Call [facility]  [phone] Other agencies that provide inexpensive medical care    [facility]  [phone]    [facility]  [phone]    [facility]  [phone]    [facility]  [phone]    [facility]  [phone]    [facility]  [phone]  Psychological Services [facility] [place] [facility]  [phone] [facility] Services  [phone] [facility]   [phone] (emergency services [phone])  Substance Abuse Resources [facility]  7795140066 [facility] ([phone] [facility] 8386592559 Floydene Flock [phone] Residential & Outpatient Substance Abuse Program  ([phone]  Abuse/Neglect [facility] Child Abuse Hotline 971-656-0996 [facility] Child Abuse Hotline 2102178063 (After Hours)  Emergency Shelter [facility] [phone]  M[facility] [phone] [facility] 8780228196  MRSA Hotline #:   719-387-5348    [facility] Resources  [facility]     [employer]                          [facility] Dept. [address] [place]                       [address]  [address]      [address]  [place]                                                Cristobal Goldmann Phone:  [phone]                                   Phone:  [phone]                 Phone:  [phone]  [facility] Phone:  [phone]  [facility] Child Abuse Hotline 717-620-2200 [phone]) [phone] (After Hours)

## 2011-08-07 NOTE — ED Notes (Signed)
Pt given saltine crackers per pt request - pt ate crackers w/o difficulty and denies nausea at present.

## 2016-07-05 ENCOUNTER — Encounter (HOSPITAL_COMMUNITY): Payer: Self-pay | Admitting: Emergency Medicine

## 2016-07-05 ENCOUNTER — Ambulatory Visit (HOSPITAL_COMMUNITY)
Admission: EM | Admit: 2016-07-05 | Discharge: 2016-07-05 | Disposition: A | Discharge status: Home / Self Care | Payer: BLUE CROSS/BLUE SHIELD | Attending: Family Medicine | Admitting: Family Medicine

## 2016-07-05 DIAGNOSIS — R69 Illness, unspecified: Secondary | ICD-10-CM | POA: Diagnosis not present

## 2016-07-05 DIAGNOSIS — J111 Influenza due to unidentified influenza virus with other respiratory manifestations: Secondary | ICD-10-CM

## 2016-07-05 MED ORDER — ACETAMINOPHEN 325 MG PO TABS
650.0000 mg | ORAL_TABLET | Freq: Once | ORAL | Status: AC
  Administered 2016-07-05: 650 mg via ORAL

## 2016-07-05 MED ORDER — ACETAMINOPHEN 325 MG PO TABS
ORAL_TABLET | ORAL | Status: AC
  Filled 2016-07-05: qty 2

## 2016-07-05 MED ORDER — HYDROCODONE-HOMATROPINE 5-1.5 MG/5ML PO SYRP: 5.0000 mL | ORAL_SOLUTION | Freq: Four times a day (QID) | ORAL | 0 refills | Status: DC | PRN

## 2016-07-05 MED ORDER — IPRATROPIUM BROMIDE 0.06 % NA SOLN: 2.0000 | Freq: Four times a day (QID) | NASAL | 1 refills | Status: DC

## 2016-07-05 NOTE — ED Triage Notes (Signed)
Pt. Stated, I've had a cough with sore throat for 2 days and a fever.

## 2016-07-05 NOTE — ED Provider Notes (Addendum)
[facility]    CSN: [id number] Arrival date & time: 07/05/16  1647     History   Chief Complaint Chief Complaint  Patient presents with  . Cough  . Fever  . Sore Throat    HPI Maria Webb is a 31 y.o. female.   The history is provided by the patient.  Cough  Cough characteristics:  Non-productive, hacking and harsh Severity:  Moderate Onset quality:  Sudden Duration:  2 days Progression:  Unchanged Chronicity:  New Smoker: no   Context: sick contacts, upper respiratory infection and weather changes   Associated symptoms: chills, fever, myalgias and rhinorrhea   Associated symptoms: no shortness of breath, no sore throat and no wheezing   Fever  Associated symptoms: chills, congestion, cough, myalgias and rhinorrhea   Associated symptoms: no sore throat   Sore Throat  Pertinent negatives include no shortness of breath.    History reviewed. No pertinent past medical history.  There are no active problems to display for this patient.   Past Surgical History:  Procedure Laterality Date  . TUBAL LIGATION      OB History    No data available       Home Medications    Prior to Admission medications   Medication Sig Start Date End Date Taking? Authorizing Provider  acetaminophen (TYLENOL) 500 MG tablet Take 500 mg by mouth every 6 (six) hours as needed. For fever    Historical Provider, MD  HYDROcodone-homatropine (HYCODAN) 5-1.5 MG/5ML syrup Take 5 mLs by mouth every 6 (six) hours as needed for cough. 07/05/16   Linna Hoff, MD  ipratropium (ATROVENT) 0.06 % nasal spray Place 2 sprays into both nostrils 4 (four) times daily. 07/05/16   Linna Hoff, MD    Family History No family history on file.  Social History Social History  Substance Use Topics  . Smoking status: Never Smoker  . Smokeless tobacco: Never Used  . Alcohol use No     Allergies   Penicillins   Review of Systems Review of Systems  Constitutional: Positive for  chills and fever.  HENT: Positive for congestion and rhinorrhea. Negative for sore throat.   Respiratory: Positive for cough. Negative for shortness of breath and wheezing.   Cardiovascular: Negative.   Gastrointestinal: Negative.   Genitourinary: Negative.   Musculoskeletal: Positive for myalgias.  All other systems reviewed and are negative.    Physical Exam Triage Vital Signs ED Triage Vitals  Enc Vitals Group     BP 07/05/16 1715 112/62     Pulse Rate 07/05/16 1715 118     Resp 07/05/16 1715 17     Temp 07/05/16 1715 103 F (39.4 C)     Temp Source 07/05/16 1715 Oral     SpO2 07/05/16 1715 100 %     Weight --      Height 07/05/16 1713 5' (1.524 m)     Head Circumference --      Peak Flow --      Pain Score 07/05/16 1714 5     Pain Loc --      Pain Edu? --      Excl. in GC? --    No data found.   Updated Vital Signs BP 112/62 (BP Location: Right Arm)   Pulse 118   Temp 103 F (39.4 C) (Oral)   Resp 17   Ht 5' (1.524 m)   LMP 06/21/2016   SpO2 100%   Visual Acuity Right  Eye Distance:   Left Eye Distance:   Bilateral Distance:    Right Eye Near:   Left Eye Near:    Bilateral Near:     Physical Exam  Constitutional: She is oriented to person, place, and time. She appears well-developed and well-nourished.  HENT:  Right Ear: External ear normal.  Left Ear: External ear normal.  Nose: Nose normal.  Mouth/Throat: Oropharynx is clear and moist.  Eyes: Pupils are equal, round, and reactive to light.  Neck: Normal range of motion. Neck supple.  Cardiovascular: Normal rate, regular rhythm, normal heart sounds and intact distal pulses.   Pulmonary/Chest: Effort normal and breath sounds normal.  Abdominal: Soft. Bowel sounds are normal.  Neurological: She is alert and oriented to person, place, and time.  Skin: Skin is warm and dry.  Nursing note and vitals reviewed.    UC Treatments / Results  Labs (all labs ordered are listed, but only abnormal  results are displayed) Labs Reviewed - No data to display  EKG  EKG Interpretation None       Radiology No results found.  Procedures Procedures (including critical care time)  Medications Ordered in UC Medications  acetaminophen (TYLENOL) tablet 650 mg (650 mg Oral Given 07/05/16 1719)     Initial Impression / Assessment and Plan / UC Course  I have reviewed the triage vital signs and the nursing notes.  Pertinent labs & imaging results that were available during my care of the patient were reviewed by me and considered in my medical decision making (see chart for details).       Final Clinical Impressions(s) / UC Diagnoses   Final diagnoses:  Influenza-like illness    New Prescriptions New Prescriptions   HYDROCODONE-HOMATROPINE (HYCODAN) 5-1.5 MG/5ML SYRUP    Take 5 mLs by mouth every 6 (six) hours as needed for cough.   IPRATROPIUM (ATROVENT) 0.06 % NASAL SPRAY    Place 2 sprays into both nostrils 4 (four) times daily.     Linna HoffJames D Beckett Maden, MD 07/05/16 1820    Linna HoffJames D Meily Glowacki, MD 07/05/16 2100

## 2016-07-05 NOTE — Discharge Instructions (Signed)
Drink plenty of fluids as discussed, use medicine as prescribed, and mucinex or delsym for cough. Return or see your doctor if further problems °

## 2019-05-03 ENCOUNTER — Other Ambulatory Visit: Payer: Self-pay

## 2019-05-03 DIAGNOSIS — Z20822 Contact with and (suspected) exposure to covid-19: Secondary | ICD-10-CM

## 2019-05-05 LAB — NOVEL CORONAVIRUS, NAA: SARS-CoV-2, NAA: NOT DETECTED

## 2023-03-10 DIAGNOSIS — Z1329 Encounter for screening for other suspected endocrine disorder: Secondary | ICD-10-CM | POA: Diagnosis not present

## 2023-03-10 DIAGNOSIS — Z01419 Encounter for gynecological examination (general) (routine) without abnormal findings: Secondary | ICD-10-CM | POA: Diagnosis not present

## 2023-03-10 DIAGNOSIS — Z131 Encounter for screening for diabetes mellitus: Secondary | ICD-10-CM | POA: Diagnosis not present

## 2023-03-10 DIAGNOSIS — Z13220 Encounter for screening for lipoid disorders: Secondary | ICD-10-CM | POA: Diagnosis not present

## 2023-03-10 DIAGNOSIS — Z01411 Encounter for gynecological examination (general) (routine) with abnormal findings: Secondary | ICD-10-CM | POA: Diagnosis not present

## 2023-03-10 DIAGNOSIS — Z0000 Encounter for general adult medical examination without abnormal findings: Secondary | ICD-10-CM | POA: Diagnosis not present

## 2023-03-10 DIAGNOSIS — Z113 Encounter for screening for infections with a predominantly sexual mode of transmission: Secondary | ICD-10-CM | POA: Diagnosis not present

## 2023-03-10 DIAGNOSIS — Z124 Encounter for screening for malignant neoplasm of cervix: Secondary | ICD-10-CM | POA: Diagnosis not present

## 2023-03-21 DIAGNOSIS — N939 Abnormal uterine and vaginal bleeding, unspecified: Secondary | ICD-10-CM | POA: Diagnosis not present

## 2023-03-21 DIAGNOSIS — Z1331 Encounter for screening for depression: Secondary | ICD-10-CM | POA: Diagnosis not present

## 2023-03-21 DIAGNOSIS — D250 Submucous leiomyoma of uterus: Secondary | ICD-10-CM | POA: Diagnosis not present

## 2023-03-21 DIAGNOSIS — N840 Polyp of corpus uteri: Secondary | ICD-10-CM | POA: Diagnosis not present

## 2023-03-21 DIAGNOSIS — N920 Excessive and frequent menstruation with regular cycle: Secondary | ICD-10-CM | POA: Diagnosis not present

## 2023-03-21 DIAGNOSIS — E875 Hyperkalemia: Secondary | ICD-10-CM | POA: Diagnosis not present

## 2023-05-22 DIAGNOSIS — N920 Excessive and frequent menstruation with regular cycle: Secondary | ICD-10-CM | POA: Diagnosis not present

## 2023-05-22 DIAGNOSIS — N939 Abnormal uterine and vaginal bleeding, unspecified: Secondary | ICD-10-CM | POA: Diagnosis not present

## 2023-05-22 DIAGNOSIS — D259 Leiomyoma of uterus, unspecified: Secondary | ICD-10-CM | POA: Diagnosis not present

## 2023-05-22 DIAGNOSIS — N840 Polyp of corpus uteri: Secondary | ICD-10-CM | POA: Diagnosis not present

## 2023-05-23 DIAGNOSIS — Z4889 Encounter for other specified surgical aftercare: Secondary | ICD-10-CM | POA: Diagnosis not present

## 2023-06-20 ENCOUNTER — Other Ambulatory Visit (HOSPITAL_COMMUNITY): Payer: Self-pay | Admitting: Obstetrics

## 2023-06-20 DIAGNOSIS — D250 Submucous leiomyoma of uterus: Secondary | ICD-10-CM

## 2023-06-20 DIAGNOSIS — S3769XA Other injury of uterus, initial encounter: Secondary | ICD-10-CM

## 2023-07-12 ENCOUNTER — Other Ambulatory Visit: Payer: Self-pay | Admitting: Obstetrics

## 2023-07-31 ENCOUNTER — Encounter (HOSPITAL_COMMUNITY): Payer: Self-pay | Admitting: Obstetrics

## 2023-07-31 NOTE — Progress Notes (Signed)
 Spoke w/ via phone for pre-op interview---[facility] Lab needs dos----   UPT per anesthesia      Lab results------ COVID test -----patient states asymptomatic no test needed Arrive at -------1100 NPO after MN NO Solid Food.  Clear liquids from MN until---1000 Pre-Surgery Ensure or G2:  Med rec completed Medications to take morning of surgery -----NONE Diabetic medication -----  GLP1 agonist last dose: GLP1 instructions:  Patient instructed no nail polish to be worn day of surgery Patient instructed to bring photo id and insurance card day of surgery Patient aware to have Driver (ride ) / caregiver    for 24 hours after surgery - Husband Gayleen [place] Patient Special Instructions -----Shower with antibacterial soap. Pre-Op special Instructions -----  Patient verbalized understanding of instructions that were given at this phone interview. Patient denies chest pain, sob, fever, cough at the interview.

## 2023-08-01 ENCOUNTER — Other Ambulatory Visit (HOSPITAL_COMMUNITY): Payer: BLUE CROSS/BLUE SHIELD

## 2023-08-07 ENCOUNTER — Ambulatory Visit (HOSPITAL_BASED_OUTPATIENT_CLINIC_OR_DEPARTMENT_OTHER): Admitting: Anesthesiology

## 2023-08-07 ENCOUNTER — Ambulatory Visit (HOSPITAL_COMMUNITY)
Admission: RE | Admit: 2023-08-07 | Discharge: 2023-08-07 | Disposition: A | Discharge status: Home / Self Care | Payer: BLUE CROSS/BLUE SHIELD | Source: Ambulatory Visit | Attending: Obstetrics | Admitting: Obstetrics

## 2023-08-07 ENCOUNTER — Encounter (HOSPITAL_COMMUNITY): Payer: Self-pay | Admitting: Obstetrics

## 2023-08-07 ENCOUNTER — Ambulatory Visit (HOSPITAL_COMMUNITY)
Admission: RE | Admit: 2023-08-07 | Discharge: 2023-08-07 | Disposition: A | Discharge status: Home / Self Care | Payer: BLUE CROSS/BLUE SHIELD | Attending: Obstetrics | Admitting: Obstetrics

## 2023-08-07 ENCOUNTER — Encounter (HOSPITAL_COMMUNITY)
Admission: RE | Disposition: A | Discharge status: Home / Self Care | Payer: Self-pay | Source: Home / Self Care | Attending: Obstetrics

## 2023-08-07 ENCOUNTER — Other Ambulatory Visit: Payer: Self-pay

## 2023-08-07 ENCOUNTER — Other Ambulatory Visit (HOSPITAL_COMMUNITY): Payer: Self-pay | Admitting: Obstetrics

## 2023-08-07 ENCOUNTER — Ambulatory Visit (HOSPITAL_COMMUNITY): Admitting: Anesthesiology

## 2023-08-07 DIAGNOSIS — D250 Submucous leiomyoma of uterus: Secondary | ICD-10-CM | POA: Insufficient documentation

## 2023-08-07 DIAGNOSIS — F419 Anxiety disorder, unspecified: Secondary | ICD-10-CM | POA: Diagnosis not present

## 2023-08-07 DIAGNOSIS — S3769XA Other injury of uterus, initial encounter: Secondary | ICD-10-CM

## 2023-08-07 DIAGNOSIS — F32A Depression, unspecified: Secondary | ICD-10-CM | POA: Insufficient documentation

## 2023-08-07 DIAGNOSIS — Z79899 Other long term (current) drug therapy: Secondary | ICD-10-CM | POA: Insufficient documentation

## 2023-08-07 DIAGNOSIS — N858 Other specified noninflammatory disorders of uterus: Secondary | ICD-10-CM | POA: Diagnosis not present

## 2023-08-07 DIAGNOSIS — Z01818 Encounter for other preprocedural examination: Secondary | ICD-10-CM

## 2023-08-07 HISTORY — PX: OPERATIVE ULTRASOUND: SHX5996

## 2023-08-07 HISTORY — DX: Depression, unspecified: F32.A

## 2023-08-07 HISTORY — DX: Anxiety disorder, unspecified: F41.9

## 2023-08-07 HISTORY — PX: DILATATION & CURETTAGE/HYSTEROSCOPY WITH MYOSURE: SHX6511

## 2023-08-07 LAB — POCT PREGNANCY, URINE: Preg Test, Ur: NEGATIVE

## 2023-08-07 SURGERY — DILATATION & CURETTAGE/HYSTEROSCOPY WITH MYOSURE
Anesthesia: General | Site: Uterus

## 2023-08-07 MED ORDER — DEXAMETHASONE SODIUM PHOSPHATE 10 MG/ML IJ SOLN
INTRAMUSCULAR | Status: AC
  Filled 2023-08-07: qty 1

## 2023-08-07 MED ORDER — SCOPOLAMINE 1 MG/3DAYS TD PT72
1.0000 | MEDICATED_PATCH | TRANSDERMAL | Status: DC
Start: 2023-08-07 — End: 2023-08-07
  Administered 2023-08-07: 1.5 mg via TRANSDERMAL

## 2023-08-07 MED ORDER — ACETAMINOPHEN 500 MG PO TABS: 1000.0000 mg | ORAL_TABLET | Freq: Four times a day (QID) | ORAL | Status: AC

## 2023-08-07 MED ORDER — SODIUM CHLORIDE 0.9 % IR SOLN
Status: DC | PRN
  Administered 2023-08-07 (×3): 3000 mL

## 2023-08-07 MED ORDER — CHLORHEXIDINE GLUCONATE 0.12 % MT SOLN
OROMUCOSAL | Status: AC
  Filled 2023-08-07: qty 15

## 2023-08-07 MED ORDER — PROPOFOL 10 MG/ML IV BOLUS
INTRAVENOUS | Status: DC | PRN
  Administered 2023-08-07: 30 mg via INTRAVENOUS
  Administered 2023-08-07: 20 mg via INTRAVENOUS
  Administered 2023-08-07: 100 mg via INTRAVENOUS

## 2023-08-07 MED ORDER — FENTANYL CITRATE (PF) 250 MCG/5ML IJ SOLN
INTRAMUSCULAR | Status: AC
  Filled 2023-08-07: qty 5

## 2023-08-07 MED ORDER — PROPOFOL 10 MG/ML IV BOLUS
INTRAVENOUS | Status: AC
  Filled 2023-08-07: qty 20

## 2023-08-07 MED ORDER — LIDOCAINE-EPINEPHRINE 1 %-1:100000 IJ SOLN
INTRAMUSCULAR | Status: DC | PRN
  Administered 2023-08-07: 10 mL

## 2023-08-07 MED ORDER — OXYCODONE HCL 5 MG PO TABS: 5.0000 mg | ORAL_TABLET | Freq: Once | ORAL | Status: DC | PRN

## 2023-08-07 MED ORDER — POLYETHYLENE GLYCOL 3350 17 G PO PACK: 17.0000 g | PACK | Freq: Every day | ORAL | Status: AC

## 2023-08-07 MED ORDER — FENTANYL CITRATE (PF) 100 MCG/2ML IJ SOLN: 25.0000 ug | INTRAMUSCULAR | Status: DC | PRN

## 2023-08-07 MED ORDER — FENTANYL CITRATE (PF) 100 MCG/2ML IJ SOLN
INTRAMUSCULAR | Status: DC | PRN
Start: 2023-08-07 — End: 2023-08-07
  Administered 2023-08-07: 50 ug via INTRAVENOUS

## 2023-08-07 MED ORDER — PHENYLEPHRINE 80 MCG/ML (10ML) SYRINGE FOR IV PUSH (FOR BLOOD PRESSURE SUPPORT)
PREFILLED_SYRINGE | INTRAVENOUS | Status: AC
  Filled 2023-08-07: qty 10

## 2023-08-07 MED ORDER — DEXMEDETOMIDINE HCL IN NACL 80 MCG/20ML IV SOLN
INTRAVENOUS | Status: DC | PRN
  Administered 2023-08-07: 8 ug via INTRAVENOUS

## 2023-08-07 MED ORDER — LIDOCAINE 2% (20 MG/ML) 5 ML SYRINGE
INTRAMUSCULAR | Status: DC | PRN
  Administered 2023-08-07: 40 mg via INTRAVENOUS

## 2023-08-07 MED ORDER — STERILE WATER FOR IRRIGATION IR SOLN
Status: DC | PRN
  Administered 2023-08-07: 1000 mL

## 2023-08-07 MED ORDER — POVIDONE-IODINE 10 % EX SWAB: 2.0000 | Freq: Once | CUTANEOUS | Status: DC

## 2023-08-07 MED ORDER — CELECOXIB 200 MG PO CAPS
ORAL_CAPSULE | ORAL | Status: AC
  Filled 2023-08-07: qty 1

## 2023-08-07 MED ORDER — SODIUM CHLORIDE 0.9 % IV SOLN: 12.5000 mg | INTRAVENOUS | Status: DC | PRN

## 2023-08-07 MED ORDER — EPHEDRINE SULFATE-NACL 50-0.9 MG/10ML-% IV SOSY
PREFILLED_SYRINGE | INTRAVENOUS | Status: DC | PRN
  Administered 2023-08-07: 15 mg via INTRAVENOUS
  Administered 2023-08-07: 10 mg via INTRAVENOUS

## 2023-08-07 MED ORDER — DEXAMETHASONE SODIUM PHOSPHATE 10 MG/ML IJ SOLN
INTRAMUSCULAR | Status: DC | PRN
  Administered 2023-08-07: 10 mg via INTRAVENOUS

## 2023-08-07 MED ORDER — MIDAZOLAM HCL 2 MG/2ML IJ SOLN
INTRAMUSCULAR | Status: AC
  Filled 2023-08-07: qty 2

## 2023-08-07 MED ORDER — LIDOCAINE 2% (20 MG/ML) 5 ML SYRINGE
INTRAMUSCULAR | Status: AC
  Filled 2023-08-07: qty 5

## 2023-08-07 MED ORDER — ONDANSETRON HCL 4 MG/2ML IJ SOLN
INTRAMUSCULAR | Status: AC
  Filled 2023-08-07: qty 2

## 2023-08-07 MED ORDER — SCOPOLAMINE 1 MG/3DAYS TD PT72
MEDICATED_PATCH | TRANSDERMAL | Status: AC
  Filled 2023-08-07: qty 1

## 2023-08-07 MED ORDER — ORAL CARE MOUTH RINSE: 15.0000 mL | Freq: Once | OROMUCOSAL | Status: AC

## 2023-08-07 MED ORDER — OXYCODONE HCL 5 MG/5ML PO SOLN: 5.0000 mg | Freq: Once | ORAL | Status: DC | PRN

## 2023-08-07 MED ORDER — AMISULPRIDE (ANTIEMETIC) 5 MG/2ML IV SOLN: 10.0000 mg | Freq: Once | INTRAVENOUS | Status: DC | PRN

## 2023-08-07 MED ORDER — MIDAZOLAM HCL 5 MG/5ML IJ SOLN
INTRAMUSCULAR | Status: DC | PRN
  Administered 2023-08-07: 2 mg via INTRAVENOUS

## 2023-08-07 MED ORDER — CHLORHEXIDINE GLUCONATE 0.12 % MT SOLN
15.0000 mL | Freq: Once | OROMUCOSAL | Status: AC
  Administered 2023-08-07: 15 mL via OROMUCOSAL

## 2023-08-07 MED ORDER — PHENYLEPHRINE 80 MCG/ML (10ML) SYRINGE FOR IV PUSH (FOR BLOOD PRESSURE SUPPORT)
PREFILLED_SYRINGE | INTRAVENOUS | Status: DC | PRN
  Administered 2023-08-07 (×3): 80 ug via INTRAVENOUS

## 2023-08-07 MED ORDER — ONDANSETRON HCL 4 MG/2ML IJ SOLN
INTRAMUSCULAR | Status: DC | PRN
  Administered 2023-08-07: 4 mg via INTRAVENOUS

## 2023-08-07 MED ORDER — IBUPROFEN 200 MG PO TABS
600.0000 mg | ORAL_TABLET | Freq: Four times a day (QID) | ORAL | Status: AC
Start: 2023-08-07 — End: 2023-08-14

## 2023-08-07 MED ORDER — LIDOCAINE-EPINEPHRINE 1 %-1:100000 IJ SOLN
INTRAMUSCULAR | Status: AC
  Filled 2023-08-07: qty 1

## 2023-08-07 MED ORDER — ACETAMINOPHEN 500 MG PO TABS
ORAL_TABLET | ORAL | Status: AC
  Filled 2023-08-07: qty 2

## 2023-08-07 MED ORDER — CELECOXIB 200 MG PO CAPS
200.0000 mg | ORAL_CAPSULE | Freq: Once | ORAL | Status: AC
  Administered 2023-08-07: 200 mg via ORAL

## 2023-08-07 MED ORDER — ACETAMINOPHEN 500 MG PO TABS
1000.0000 mg | ORAL_TABLET | Freq: Once | ORAL | Status: AC
  Administered 2023-08-07: 1000 mg via ORAL

## 2023-08-07 MED ORDER — LACTATED RINGERS IV SOLN: INTRAVENOUS | Status: DC

## 2023-08-07 MED ORDER — SILVER NITRATE-POT NITRATE 75-25 % EX MISC
CUTANEOUS | Status: DC | PRN
  Administered 2023-08-07: 6

## 2023-08-07 SURGICAL SUPPLY — 18 items
DEVICE MYOSURE REACH (MISCELLANEOUS) IMPLANT
GAUZE 4X4 16PLY ~~LOC~~+RFID DBL (SPONGE) IMPLANT
GLOVE BIO SURGEON STRL SZ7 (GLOVE) ×2 IMPLANT
GLOVE BIOGEL PI IND STRL 7.0 (GLOVE) IMPLANT
GLOVE BIOGEL PI IND STRL 7.5 (GLOVE) IMPLANT
GLOVE SURG UNDER POLY LF SZ7 (GLOVE) ×4 IMPLANT
GOWN STRL REUS W/ TWL LRG LVL3 (GOWN DISPOSABLE) ×4 IMPLANT
GOWN STRL SURGICAL XL XLNG (GOWN DISPOSABLE) IMPLANT
KIT PROCEDURE FLUENT (KITS) ×2 IMPLANT
KIT TURNOVER KIT B (KITS) ×2 IMPLANT
PACK VAGINAL MINOR WOMEN LF (CUSTOM PROCEDURE TRAY) ×2 IMPLANT
PAD OB MATERNITY 11 LF (PERSONAL CARE ITEMS) ×2 IMPLANT
SEAL ROD LENS SCOPE MYOSURE (ABLATOR) ×2 IMPLANT
SOL PREP POV-IOD 4OZ 10% (MISCELLANEOUS) IMPLANT
SUT VIC AB 2-0 CT1 36 (SUTURE) IMPLANT
TOWEL GREEN STERILE FF (TOWEL DISPOSABLE) ×4 IMPLANT
UNDERPAD 30X36 HEAVY ABSORB (UNDERPADS AND DIAPERS) ×2 IMPLANT
WATER STERILE IRR 1000ML POUR (IV SOLUTION) IMPLANT

## 2023-08-07 NOTE — Anesthesia Postprocedure Evaluation (Signed)
 Anesthesia Post Note  Patient: Ebany J Faiella  Procedure(s) Performed: DILATATION & CURETTAGE/HYSTEROSCOPY WITH MYOSURE (Uterus) [place] INTRAOPERATIVE (Abdomen)     Patient location during evaluation: PACU Anesthesia Type: General Level of consciousness: awake and alert Pain management: pain level controlled Vital Signs Assessment: post-procedure vital signs reviewed and stable Respiratory status: spontaneous breathing, nonlabored ventilation, respiratory function stable and patient connected to nasal cannula oxygen Cardiovascular status: blood pressure returned to baseline and stable Postop Assessment: no apparent nausea or vomiting Anesthetic complications: no   No notable events documented.  Last Vitals:  Vitals:   08/07/23 1415 08/07/23 1430  BP: (!) 106/93 111/62  Pulse: (!) 103 99  Resp: (!) 21 18  Temp:  (!) 36.4 C  SpO2: 92% 94%    Last Pain:  Vitals:   08/07/23 1414  TempSrc:   PainSc: 0-No pain                 Brandon Nation

## 2023-08-07 NOTE — Transfer of Care (Signed)
 Immediate Anesthesia Transfer of Care Note  Patient: Maria Webb  Procedure(s) Performed: DILATATION & CURETTAGE/HYSTEROSCOPY WITH MYOSURE (Uterus) [place] INTRAOPERATIVE (Abdomen)  Patient Location: PACU  Anesthesia Type:General  Level of Consciousness: awake, alert , oriented, and patient cooperative  Airway & Oxygen Therapy: Patient Spontanous Breathing  Post-op Assessment: Report given to RN and Post -op Vital signs reviewed and stable  Post vital signs: Reviewed and stable  Last Vitals:  Vitals Value Taken Time  BP 114/63 08/07/23 1410  Temp    Pulse 186 08/07/23 1411  Resp 18 08/07/23 1412  SpO2 97 % 08/07/23 1411  Vitals shown include unfiled device data.  Last Pain:  Vitals:   08/07/23 1124  TempSrc: Oral  PainSc: 6       Patients Stated Pain Goal: 5 (08/07/23 1124)  Complications: No notable events documented.

## 2023-08-07 NOTE — H&P (Signed)
 Pre-Op Surgical H&P   S: Patient is a 38 yo G32P3003 female with no significant PMH presenting for irregular and painful menses. On ultrasound, patient diagnosed with endometrial polyp and submucosal fibroid. Underwent attempted hysteroscopic resection 05/22/23 however with suspected uterine perforation upon entry given rapid rise in fluid deficit. Procedure aborted prior to entering uterine cavity. Patient recovered well post-operatively and is now >2 months from suspected perforation with continued bleeding.   Past Medical History:  Diagnosis Date   Anxiety    Depression    Past Surgical History:  Procedure Laterality Date   TUBAL LIGATION     Current Outpatient Medications  Medication Instructions   escitalopram (LEXAPRO) 15 mg, Oral, Daily at bedtime   ibuprofen (ADVIL) 400-600 mg, Every 8 hours PRN    Allergies  Allergen Reactions   Penicillins     Childhood allergy    O:  GA: well appearing, NAD Chest: normal work of breathing on room air Abd: soft, NTND Ext: no signs of DVT  U/S (03/21/23): Submucosal fibroid at fundus noted measuring 2.5cm, distorting uterine cavity. Additionally, small polyp measuring 1.5cm noted. Ovaries appear normal bilaterally.  A/P: 38yo G3P3003 female with AUB presenting for resection of submucosal fibroid and endometrial polyp. Patient with attempted resection 05/22/23 c/b suspected uterine perforation and aborted procedure. Plan to proceed with hysteroscopic resection under ultrasound guidance. Risks of surgery including bleeding, infection, damage to surrounding organs reviewed with patient. Informed consent signed. Plan to proceed to OR when ready.   Marlene Bast, MD

## 2023-08-07 NOTE — Interval H&P Note (Signed)
 History and Physical Interval Note:  08/07/2023 12:00 PM  Maria Webb  has presented today for surgery, with the diagnosis of Submucosal Myoma, Perforation of Uterus.  The various methods of treatment have been discussed with the patient and family. After consideration of risks, benefits and other options for treatment, the patient has consented to  Procedure(s) with comments: DILATATION & CURETTAGE/HYSTEROSCOPY WITH MYOSURE (N/A) - Requests 1hr./[facility] [place] INTRAOPERATIVE (N/A) as a surgical intervention.  The patient's history has been reviewed, patient examined, no change in status, stable for surgery.  I have reviewed the patient's chart and labs.  Questions were answered to the patient's satisfaction.    Vitals:   08/07/23 1124  BP: (!) 113/58  Pulse: 67  Resp: 16  Temp: 97.9 F (36.6 C)  SpO2: 99%     [name]

## 2023-08-07 NOTE — Anesthesia Procedure Notes (Signed)
 Procedure Name: LMA Insertion Date/Time: 08/07/2023 1:03 PM  Performed by: Bishop Limbo, CRNAPre-anesthesia Checklist: Patient identified, Emergency Drugs available, Suction available and Patient being monitored Patient Re-evaluated:Patient Re-evaluated prior to induction Oxygen Delivery Method: Circle System Utilized Preoxygenation: Pre-oxygenation with 100% oxygen Induction Type: IV induction Ventilation: Mask ventilation without difficulty LMA: LMA inserted LMA Size: 3.0 Number of attempts: 1 Placement Confirmation: positive ETCO2 Tube secured with: Tape Dental Injury: Teeth and Oropharynx as per pre-operative assessment

## 2023-08-07 NOTE — Op Note (Signed)
 Operative Report  Surgeon: Antony Salmon D'iorio, MD  Assistant: None  Preoperative Diagnosis: Submucosal fibroid  Postoperative Diagnosis: Same  Anesthesia: General  Implants: None  Specimen: Fibroid  Drains: None  Fluids: Per anesthesia record  EBL: 5 mL   Fluid deficit: 645 mL   Complications: None  Findings: EUA: Normal external female genitalia. Normal appearing vagina and cervix.  Intra-op: Very retroverted uterus with difficult entry into uterine cavity, aided by ultrasound guidance. Bilateral ostia visualized. Endometrium showed proliferative sloughing endometrium c/w menses with submucosal fibroid(s) measuring 3 cm at fundus. Hemostatic at end of case.   Procedure in Detail:  The patient was seen in the preoperative holding area, consents were verified and all questions and concerns related to the proposed procedure were discussed in detail. The patient was transferred to the operating room where SCD's were placed. The patient underwent general anesthesia with LMA intubation without complication. The patient was placed in dorsal lithotomy position and prepped and draped in usual sterile fashion.The hysteroscope was prepped and zeroed. A time out was performed with all team members.   Attention was turned to the vagina. A speculum was placed and cervix was visualized. The anterior lip of the cervix was grasped with a single-tooth tenaculum. The cervix was serially dilated to #18 pratt dilator with ultrasound guidance due to history of uterine perforation with previous attempt of dilation. The hysteroscope was passed through the cervix into the uterine cavity with ultrasound guidance with findings as above. Bilateral ostia were visualized. Myosure device was then used to remove tissue and perform global sampling of endometrium. Global view of endometrium at end of case showed no remaining abnormal tissue. All instruments were removed from the uterus and vagina. Cervix was inspected  and hemostasis ensured. All sponge and instrument counts correct x2.   Patient was awakened from anesthesia and taken to the recovery room in stable condition.   Marlene Bast, MD

## 2023-08-07 NOTE — Discharge Instructions (Addendum)
 Post-Operative Discharge Instructions  Surgery: Dilation and Curettage, hysteroscopy with submucosal myoma resection  Surgeon: Marlene Bast, MD  Please follow the instructions below during your recovery period. If you have questions or concerns please call our office at [phone].  Please look out for the following and call the office immediately or go to nearest Emergency Room if you experience: - Chest pain - Shortness of breath - Fever >100.4 F  - Persistent nausea and/or vomiting - Difficulty urinating or not urinating for >6 hours  - Severe abdominal or pelvic pain not responsive to recommended pain medications - Heavy vaginal bleeding (soaking >2 pads per hour)  - Pain, swelling, and/or redness in one leg   Activity: You may resume normal activity as tolerated. We recommend walking as soon and as much as you are comfortably able.  Please do not put in anything in the vagina for 2 weeks after your surgery. This includes: - No intercourse - No tampons - No douching - No sitting in water (e.g. pools, baths, etc.)   Expectations: - Light vaginal bleeding is normal after your procedure and should resolve in the next few days. - Some abdominal soreness and bloating is normal. Please see below for recommended medications.  - Feeling more tired than usual for a few days following anesthesia is normal.   Diet: You may resume normal diet immediately after surgery. Anesthesia can make you feel nauseous, we recommend avoiding spicy or acidic foods for the first 72 hours after surgery. Drink lots of water (64oz/day).   Pain: We expect you to have some abdominal/pelvic pain and cramping for a few days after your surgery. We recommend taking medication around the clock for the first 48-72 hours after your surgery as follows: - Acetaminophen (Tylenol) 1000mg  every 6 hours  - Ibuprofen (Advil or Motrin) 600mg  every 6 hours - Miralax or other stool softener to keep bowel movements soft  and avoid straining   We wish you a speedy recovery!    Post Anesthesia Home Care Instructions  Activity: Get plenty of rest for the remainder of the day. A responsible individual must stay with you for 24 hours following the procedure.  For the next 24 hours, DO NOT: -Drive a car -Advertising copywriter -Drink alcoholic beverages -Take any medication unless instructed by your physician -Make any legal decisions or sign important papers.  Meals: Start with liquid foods such as gelatin or soup. Progress to regular foods as tolerated. Avoid greasy, spicy, heavy foods. If nausea and/or vomiting occur, drink only clear liquids until the nausea and/or vomiting subsides. Call your physician if vomiting continues.  Special Instructions/Symptoms: Your throat may feel dry or sore from the anesthesia or the breathing tube placed in your throat during surgery. If this causes discomfort, gargle with warm salt water. The discomfort should disappear within 24 hours.  If you had a scopolamine patch placed behind your ear for the management of post- operative nausea and/or vomiting:  1. The medication in the patch is effective for 72 hours, after which it should be removed.  Wrap patch in a tissue and discard in the trash. Wash hands thoroughly with soap and water. 2. You may remove the patch earlier than 72 hours if you experience unpleasant side effects which may include dry mouth, dizziness or visual disturbances. 3. Avoid touching the patch. Wash your hands with soap and water after contact with the patch.    No acetaminophen/Tylenol until after 5:30 pm today if needed. No ibuprofen, Advil, Aleve,  Motrin, ketorolac, meloxicam, naproxen, or other NSAIDS until after 5:30 pm today if needed.

## 2023-08-07 NOTE — Anesthesia Preprocedure Evaluation (Addendum)
 Anesthesia Evaluation    Reviewed: Allergy & Precautions, Patient's Chart, lab work & pertinent test results  History of Anesthesia Complications Negative for: history of anesthetic complications  Airway Mallampati: II  TM Distance: >3 FB Neck ROM: Full    Dental no notable dental hx.    Pulmonary neg pulmonary ROS   Pulmonary exam normal breath sounds clear to auscultation       Cardiovascular negative cardio ROS Normal cardiovascular exam Rhythm:Regular Rate:Normal     Neuro/Psych  PSYCHIATRIC DISORDERS Anxiety Depression    negative neurological ROS     GI/Hepatic negative GI ROS, Neg liver ROS,,,  Endo/Other  negative endocrine ROS    Renal/GU negative Renal ROS     Musculoskeletal negative musculoskeletal ROS (+)    Abdominal   Peds  Hematology negative hematology ROS (+)   Anesthesia Other Findings   Reproductive/Obstetrics  s/p tubal ligation                              Anesthesia Physical Anesthesia Plan  ASA: 1  Anesthesia Plan: General   Post-op Pain Management: Tylenol PO (pre-op)* and Celebrex PO (pre-op)*   Induction: Intravenous  PONV Risk Score and Plan: 4 or greater and Treatment may vary due to age or medical condition, Ondansetron, Dexamethasone, Midazolam and Scopolamine patch - Pre-op  Airway Management Planned: LMA  Additional Equipment: None  Intra-op Plan:   Post-operative Plan: Extubation in OR  Informed Consent:   Plan Discussed with: CRNA and Anesthesiologist  Anesthesia Plan Comments:        Anesthesia Quick Evaluation

## 2023-08-08 ENCOUNTER — Encounter (HOSPITAL_COMMUNITY): Payer: Self-pay | Admitting: Obstetrics

## 2023-08-08 LAB — SURGICAL PATHOLOGY
